# Patient Record
Sex: Female | Born: 1965 | Race: Asian | Hispanic: No | Marital: Single | Smoking: Never smoker
Health system: Southern US, Community
[De-identification: ages and names within clinical notes are randomized; demographics above are authoritative.]

---

## 2006-09-23 ENCOUNTER — Encounter (INDEPENDENT_AMBULATORY_CARE_PROVIDER_SITE_OTHER): Payer: Self-pay | Admitting: Specialist

## 2006-09-23 ENCOUNTER — Ambulatory Visit (HOSPITAL_COMMUNITY): Admission: RE | Admit: 2006-09-23 | Discharge: 2006-09-23 | Payer: Self-pay | Admitting: Obstetrics and Gynecology

## 2009-03-31 ENCOUNTER — Emergency Department (HOSPITAL_COMMUNITY): Admission: EM | Admit: 2009-03-31 | Discharge: 2009-03-31 | Payer: Self-pay | Admitting: Emergency Medicine

## 2010-12-27 NOTE — Op Note (Signed)
NAMEMICHAELLA, [name]                  ACCOUNT NO.:  [id number]   MEDICAL RECORD NO.:  [id number]          PATIENT TYPE:  AMB   LOCATION:  SDC                           FACILITY:  [facility]   PHYSICIAN:  Juluis Mire, M.D.   DATE OF BIRTH:  Mar 22, 1966   DATE OF PROCEDURE:  09/23/2006  DATE OF DISCHARGE:                               OPERATIVE REPORT   PREOPERATIVE DIAGNOSIS:  Endometrial polyp.   POSTOPERATIVE DIAGNOSIS:  Endometrial polyp.   OPERATIVE PROCEDURE:  Hysteroscopy, resection of polyp and subsequent  endometrial curettings.   SURGEON:  Juluis Mire, M.D.   ANESTHESIA:  General.   ESTIMATED BLOOD LOSS:  Minimal.   PACK AND DRAINS:  None.   INTRAOPERATIVE BLOOD REPLACEMENT:  None.   COMPLICATIONS:  None.   INDICATIONS:  In dictated history and physical.   DESCRIPTION OF PROCEDURE:  The patient was taken to the OR and placed in  a supine position.  After satisfactory level of general anesthesia was  obtained, the patient was placed in the dorsal lithotomy position using  the Allen stirrups.  The patient's perineum and vagina were prepped out  with Betadine and draped into a sterile field.  A speculum was placed in  the vaginal vault and cervix grasped with a single-tooth tenaculum.  Uterus sounded to 8 cm and cervix serially dilated to a size 35 Pratt  dilator.  Operative hysteroscope was introduced.  Intrauterine cavity  was distended using sorbitol.  Visualization revealed a polyp-like  outgrowth on the posterior wall; this was resected and sent for  pathologic review.  We then obtained endometrial curettings.  No other  pathology was noted.  Total deficit was 150 mL.  At this point in time,  a single-tooth tenaculum and speculum were then removed, patient taken  out of the dorsal lithotomy position and once alert and extubated,  transferred to recovery room in good condition.  Sponge, needle and  instrument count was reported as correct by circulating  nurse      Juluis Mire, M.D.  Electronically Signed     JSM/MEDQ  D:  09/23/2006  T:  09/23/2006  Job:  [id number]

## 2010-12-27 NOTE — H&P (Signed)
Sabrina [name], Sabrina [name]                  ACCOUNT NO.:  [id number]   MEDICAL RECORD NO.:  [id number]          PATIENT TYPE:  AMB   LOCATION:  SDC                           FACILITY:  [facility]   PHYSICIAN:  Juluis Mire, M.D.   DATE OF BIRTH:  1966/03/28   DATE OF ADMISSION:  09/23/2006  DATE OF DISCHARGE:                              HISTORY & PHYSICAL   HISTORY OF PRESENT ILLNESS:  The patient is a 45 year old gravida 2,  para 2 female who presents for saline infusion ultrasound.   In relation to the present admission, the patient has been having  increasing abnormal bleeding as well as right lower quadrant pain.  She  underwent an ultrasound that revealed a 1.2-cm polyp-like thickening of  the endometrium.  Ovaries were unremarkable.  We subsequently did a  saline infusion ultrasound that confirmed the presence of a relatively  large polyp.  She had an ovarian cyst that did resolve.  In view of the  abnormal bleeding, endometrial polyp and pelvic pain, we decided to  proceed with hysteroscopy.  We discussed the options of laparoscopy,  which she declined at the present time; therefore, we will proceed with  hysteroscopic resection of the polyp.   ALLERGIES:  NO KNOWN DRUG ALLERGIES.   MEDICATIONS:  None.   PAST MEDICAL HISTORY:  Usual childhood diseases.  No significant  sequelae.   PAST SURGICAL HISTORY:  She has had a ganglionic cyst removed from the  left hand.   OBSTETRICAL HISTORY:  Two vaginal deliveries.   FAMILY HISTORY:  Noncontributory.   SOCIAL HISTORY:  No tobacco or alcohol use.   REVIEW OF SYSTEMS:  Noncontributory.   PHYSICAL EXAM:  VITAL SIGNS:  The patient is afebrile with stable vital  signs.  HEENT:  The patient is normocephalic.  Pupils are equal, round and  reactive to light and accommodation.  Extraocular movements were intact.  Sclerae and conjunctivae are clear.  Oropharynx clear.  NECK:  Without thyromegaly.  BREASTS:  Not examined.  LUNGS:  Clear.  CARDIOVASCULAR:  Regular rhythm and rate.  No murmurs or gallops.  ABDOMEN:  Exam is benign.  No masses, organomegaly or tenderness.  PELVIC:  Normal external genitalia.  Vaginal mucosa is clear.  Cervix  unremarkable.  Uterus normal size, shape and contour.  Adnexa free of  masses or tenderness.  EXTREMITIES:  Trace edema.  NEUROLOGIC:  Exam is grossly within normal limits.   IMPRESSION:  Endometrial polyp.   PLAN:  The patient will undergo hysteroscopic resection of polyp.  The  risks have been explained including the risk of infection, the risk of  vascular injury that could lead to hemorrhage requiring transfusions  with the risk of AIDS or hepatitis.  There is also a potential risk if  bleeding is heavy for a hysterectomy.  There is also a risk of  perforation that could lead to injury to adjacent organs, requiring  exploratory surgery, risk of deep venous thrombosis and pulmonary  embolus.      Juluis Mire, M.D.  Electronically Signed     JSM/MEDQ  D:  09/23/2006  T:  09/23/2006  Job:  [id number]

## 2013-07-19 DIAGNOSIS — K2100 Gastro-esophageal reflux disease with esophagitis, without bleeding: Secondary | ICD-10-CM

## 2013-07-19 DIAGNOSIS — K644 Residual hemorrhoidal skin tags: Secondary | ICD-10-CM | POA: Insufficient documentation

## 2013-07-19 DIAGNOSIS — N921 Excessive and frequent menstruation with irregular cycle: Secondary | ICD-10-CM

## 2013-07-19 DIAGNOSIS — R1314 Dysphagia, pharyngoesophageal phase: Secondary | ICD-10-CM

## 2013-07-19 HISTORY — DX: Excessive and frequent menstruation with irregular cycle: N92.1

## 2013-07-19 HISTORY — DX: Dysphagia, pharyngoesophageal phase: R13.14

## 2013-07-19 HISTORY — DX: Residual hemorrhoidal skin tags: K64.4

## 2013-07-19 HISTORY — DX: Gastro-esophageal reflux disease with esophagitis, without bleeding: K21.00

## 2014-12-12 DIAGNOSIS — E739 Lactose intolerance, unspecified: Secondary | ICD-10-CM

## 2014-12-12 HISTORY — DX: Lactose intolerance, unspecified: E73.9

## 2017-09-18 DIAGNOSIS — E559 Vitamin D deficiency, unspecified: Secondary | ICD-10-CM

## 2017-09-18 HISTORY — DX: Vitamin D deficiency, unspecified: E55.9

## 2018-06-08 DIAGNOSIS — Z1211 Encounter for screening for malignant neoplasm of colon: Secondary | ICD-10-CM

## 2018-06-08 HISTORY — DX: Encounter for screening for malignant neoplasm of colon: Z12.11

## 2019-07-27 DIAGNOSIS — R7303 Prediabetes: Secondary | ICD-10-CM

## 2019-07-27 DIAGNOSIS — R1012 Left upper quadrant pain: Secondary | ICD-10-CM

## 2019-07-27 DIAGNOSIS — M79645 Pain in left finger(s): Secondary | ICD-10-CM

## 2019-07-27 DIAGNOSIS — G8929 Other chronic pain: Secondary | ICD-10-CM

## 2019-07-27 DIAGNOSIS — M25561 Pain in right knee: Secondary | ICD-10-CM | POA: Insufficient documentation

## 2019-07-27 HISTORY — DX: Left upper quadrant pain: R10.12

## 2019-07-27 HISTORY — DX: Pain in right knee: M25.561

## 2019-07-27 HISTORY — DX: Pain in left finger(s): M79.645

## 2019-07-27 HISTORY — DX: Other chronic pain: G89.29

## 2019-07-27 HISTORY — DX: Prediabetes: R73.03

## 2019-11-11 ENCOUNTER — Emergency Department (HOSPITAL_BASED_OUTPATIENT_CLINIC_OR_DEPARTMENT_OTHER)
Admission: EM | Admit: 2019-11-11 | Discharge: 2019-11-11 | Disposition: A | Discharge status: Home / Self Care | Payer: BC Managed Care – PPO | Attending: Emergency Medicine | Admitting: Emergency Medicine

## 2019-11-11 ENCOUNTER — Other Ambulatory Visit: Payer: Self-pay

## 2019-11-11 ENCOUNTER — Emergency Department (HOSPITAL_BASED_OUTPATIENT_CLINIC_OR_DEPARTMENT_OTHER): Payer: BC Managed Care – PPO

## 2019-11-11 ENCOUNTER — Encounter (HOSPITAL_BASED_OUTPATIENT_CLINIC_OR_DEPARTMENT_OTHER): Payer: Self-pay | Admitting: *Deleted

## 2019-11-11 DIAGNOSIS — R079 Chest pain, unspecified: Secondary | ICD-10-CM

## 2019-11-11 DIAGNOSIS — E876 Hypokalemia: Secondary | ICD-10-CM | POA: Insufficient documentation

## 2019-11-11 DIAGNOSIS — R0789 Other chest pain: Secondary | ICD-10-CM | POA: Insufficient documentation

## 2019-11-11 LAB — HEPATIC FUNCTION PANEL
ALT: 20 U/L (ref 0–44)
AST: 16 U/L (ref 15–41)
Albumin: 4.2 g/dL (ref 3.5–5.0)
Alkaline Phosphatase: 74 U/L (ref 38–126)
Bilirubin, Direct: 0.1 mg/dL (ref 0.0–0.2)
Indirect Bilirubin: 0.7 mg/dL (ref 0.3–0.9)
Total Bilirubin: 0.8 mg/dL (ref 0.3–1.2)
Total Protein: 7.4 g/dL (ref 6.5–8.1)

## 2019-11-11 LAB — TROPONIN I (HIGH SENSITIVITY)
Troponin I (High Sensitivity): 2 ng/L (ref <18)
Troponin I (High Sensitivity): 2 ng/L (ref <18)

## 2019-11-11 LAB — CBC
HCT: 40.4 % (ref 36.0–46.0)
Hemoglobin: 13.5 g/dL (ref 12.0–15.0)
MCH: 29.5 pg (ref 26.0–34.0)
MCHC: 33.4 g/dL (ref 30.0–36.0)
MCV: 88.4 fL (ref 80.0–100.0)
Platelets: 190 10*3/uL (ref 150–400)
RBC: 4.57 MIL/uL (ref 3.87–5.11)
RDW: 12.1 % (ref 11.5–15.5)
WBC: 6.3 10*3/uL (ref 4.0–10.5)
nRBC: 0 % (ref 0.0–0.2)

## 2019-11-11 LAB — BASIC METABOLIC PANEL
Anion gap: 8 (ref 5–15)
BUN: 10 mg/dL (ref 6–20)
CO2: 27 mmol/L (ref 22–32)
Calcium: 9 mg/dL (ref 8.9–10.3)
Chloride: 102 mmol/L (ref 98–111)
Creatinine, Ser: 0.55 mg/dL (ref 0.44–1.00)
GFR calc Af Amer: >60 mL/min (ref >60)
GFR calc non Af Amer: >60 mL/min (ref >60)
Glucose, Bld: 123 mg/dL — ABNORMAL HIGH (ref 70–99)
Potassium: 3.2 mmol/L — ABNORMAL LOW (ref 3.5–5.1)
Sodium: 137 mmol/L (ref 135–145)

## 2019-11-11 LAB — LIPASE, BLOOD: Lipase: 22 U/L (ref 11–51)

## 2019-11-11 MED ORDER — ALUM & MAG HYDROXIDE-SIMETH 200-200-20 MG/5ML PO SUSP
30.0000 mL | Freq: Once | ORAL | Status: AC
  Administered 2019-11-11: 30 mL via ORAL
  Filled 2019-11-11: qty 30

## 2019-11-11 MED ORDER — FAMOTIDINE IN NACL 20-0.9 MG/50ML-% IV SOLN
20.0000 mg | Freq: Once | INTRAVENOUS | Status: AC
  Administered 2019-11-11: 20 mg via INTRAVENOUS
  Filled 2019-11-11: qty 50

## 2019-11-11 MED ORDER — HYDROXYZINE HCL 10 MG PO TABS: 10.0000 mg | ORAL_TABLET | Freq: Three times a day (TID) | ORAL | 0 refills | Status: DC | PRN

## 2019-11-11 MED ORDER — SODIUM CHLORIDE 0.9 % IV BOLUS
1000.0000 mL | Freq: Once | INTRAVENOUS | Status: AC
  Administered 2019-11-11: 1000 mL via INTRAVENOUS

## 2019-11-11 MED ORDER — POTASSIUM CHLORIDE CRYS ER 20 MEQ PO TBCR
40.0000 meq | EXTENDED_RELEASE_TABLET | Freq: Once | ORAL | Status: AC
  Administered 2019-11-11: 21:00:00 40 meq via ORAL
  Filled 2019-11-11: qty 2

## 2019-11-11 MED ORDER — SUCRALFATE 1 GM/10ML PO SUSP: 1.0000 g | Freq: Three times a day (TID) | ORAL | 0 refills | Status: DC

## 2019-11-11 MED ORDER — POTASSIUM CHLORIDE CRYS ER 20 MEQ PO TBCR: 20.0000 meq | EXTENDED_RELEASE_TABLET | Freq: Every day | ORAL | 0 refills | Status: DC

## 2019-11-11 NOTE — Discharge Instructions (Signed)
You were seen in the emergency department today for chest pain. Your work-up in the emergency department has been overall reassuring. Your labs have been fairly normal and or similar to previous blood work you have had done- your potassium was low- we are sending you home with a supplement and diet guidelines.. Your EKG and the enzyme we use to check your heart did not show an acute heart attack at this time. Your chest x-ray showed possible very small amounts of fluid at the bottom of your lungs but looked overall very reassuring.    We are also sending you home with Atarax to take every 8 hours as needed for anxiety/stress as well as Carafate to take prior to meals and prior to bedtime to help coat your stomach and to avoid irritation.  Please continue your pantoprazole at home.  We have prescribed you new medication(s) today. Discuss the medications prescribed today with your pharmacist as they can have adverse effects and interactions with your other medicines including over the counter and prescribed medications. Seek medical evaluation if you start to experience new or abnormal symptoms after taking one of these medicines, seek care immediately if you start to experience difficulty breathing, feeling of your throat closing, facial swelling, or rash as these could be indications of a more serious allergic reaction   We would like you to follow up closely with your primary care provider and/or the cardiologist provided in your discharge instructions within 1-3 days. Return to the ER immediately should you experience any new or worsening symptoms including but not limited to return of pain, worsened pain, vomiting, shortness of breath, dizziness, lightheadedness, passing out, or any other concerns that you may have.

## 2019-11-11 NOTE — ED Provider Notes (Signed)
[facility]GENCY DEPART[facility] Provider Note   CSN: [id number] Arrival date & time: 11/11/19  1703     History Chief Complaint  Patient presents with  . Chest Pain    Sabrina [name] is a 54 y.o. female without significant past medical history who presents to the emergency department with complaints of chest pain for the past couple of days.  Patient states the pain is located in her central lower chest epigastric area, it is constant, worse after eating and with stress, no alleviating factors.  Discomfort is described as a burning pain that is currently a 2 out of 10 in severity.  She has associated feeling that her stomach is upset.  Denies fever, chills, vomiting, diarrhea, melena, hematochezia, dyspnea, prior CAD, leg pain/swelling, hemoptysis, recent surgery/trauma, recent long travel, hormone use, personal hx of cancer, or hx of DVT/PE.  Her sister is present at the bedside and is concerned that she is stressed due to her husband being sick with cancer, patient sometimes does not eat as much as she should.  HPI     History reviewed. No pertinent past medical history.  There are no problems to display for this patient.   History reviewed. No pertinent surgical history.   OB History   No obstetric history on file.     History reviewed. No pertinent family history.  Social History   Tobacco Use  . Smoking status: Never Smoker  . Smokeless tobacco: Never Used  Substance Use Topics  . Alcohol use: Never  . Drug use: Never    Home Medications Prior to Admission medications   Not on File    Allergies    Patient has no known allergies.  Review of Systems   Review of Systems  Constitutional: Negative for chills, diaphoresis and fever.  Respiratory: Negative for cough and shortness of breath.   Cardiovascular: Positive for chest pain. Negative for leg swelling.  Gastrointestinal: Positive for abdominal pain. Negative for blood in stool, constipation,  diarrhea and vomiting.  Genitourinary: Negative for dysuria.  Neurological: Negative for weakness and numbness.  All other systems reviewed and are negative.   Physical Exam Updated Vital Signs BP (!) 140/91   Pulse 77   Temp 98 F (36.7 C) (Oral)   Resp 20   Ht 4' 11.5" (1.511 m)   Wt 48.1 kg   SpO2 98%   BMI 21.05 kg/m   Physical Exam Vitals and nursing note reviewed.  Constitutional:      General: She is not in acute distress.    Appearance: She is well-developed. She is not toxic-appearing.  HENT:     Head: Normocephalic and atraumatic.  Eyes:     General:        Right eye: No discharge.        Left eye: No discharge.     Conjunctiva/sclera: Conjunctivae normal.  Cardiovascular:     Rate and Rhythm: Normal rate and regular rhythm.     Pulses:          Radial pulses are 2+ on the right side and 2+ on the left side.  Pulmonary:     Effort: Pulmonary effort is normal. No respiratory distress.     Breath sounds: Normal breath sounds. No wheezing, rhonchi or rales.  Chest:     Chest wall: No tenderness.  Abdominal:     General: There is no distension.     Palpations: Abdomen is soft.     Tenderness: There is abdominal tenderness (  very mild) in the epigastric area. There is no guarding or rebound. Negative signs include Murphy's sign.  Musculoskeletal:     Cervical back: Neck supple.     Right lower leg: No tenderness. No edema.     Left lower leg: No tenderness. No edema.  Skin:    General: Skin is warm and dry.     Findings: No rash.  Neurological:     Mental Status: She is alert.     Comments: Clear speech.   Psychiatric:        Behavior: Behavior normal.     ED Results / Procedures / Treatments   Labs (all labs ordered are listed, but only abnormal results are displayed) Labs Reviewed  BASIC METABOLIC PANEL - Abnormal; Notable for the following components:      Result Value   Potassium 3.2 (*)    Glucose, Bld 123 (*)    All other components within  normal limits  CBC  HEPATIC FUNCTION PANEL  LIPASE, BLOOD  TROPONIN I (HIGH SENSITIVITY)  TROPONIN I (HIGH SENSITIVITY)    EKG EKG Interpretation  Date/Time:  Friday November 11 2019 17:07:01 EDT Ventricular Rate:  77 PR Interval:  184 QRS Duration: 74 QT Interval:  370 QTC Calculation: 418 R Axis:   83 Text Interpretation: Normal sinus rhythm Normal ECG Confirmed by Marianna Fuss ([id number]) on 11/11/2019 8:53:44 PM   Radiology DG Chest 2 View  Result Date: 11/11/2019 CLINICAL DATA:  Chest pain. Dizziness. EXAM: CHEST - 2 VIEW COMPARISON:  None. FINDINGS: The cardiomediastinal contours are normal. Possible tiny pleural effusions. Pulmonary vasculature is normal. No consolidation or pneumothorax. No acute osseous abnormalities are seen. IMPRESSION: Possible tiny pleural effusions. Electronically Signed   By: Narda Rutherford M.D.   On: 11/11/2019 18:45    Procedures Procedures (including critical care time)  Medications Ordered in ED Medications  potassium chloride SA (KLOR-CON) CR tablet 40 mEq (has no administration in time range)  sodium chloride 0.9 % bolus 1,000 mL ( Intravenous Stopped 11/11/19 1951)  famotidine (PEPCID) IVPB 20 mg premix (0 mg Intravenous Stopped 11/11/19 1923)  alum & mag hydroxide-simeth (MAALOX/MYLANTA) 200-200-20 MG/5ML suspension 30 mL (30 mLs Oral Given 11/11/19 2024)    ED Course  I have reviewed the triage vital signs and the nursing notes.  Pertinent labs & imaging results that were available during my care of the patient were reviewed by me and considered in my medical decision making (see chart for details).    MDM Rules/Calculators/A&P                      Patient presents to the emergency department with chest pain. Patient nontoxic appearing, in no apparent distress, vitals without significant abnormality. Fairly benign physical exam, very mild epigastric tenderness without peritoneal signs, negative murphys.   DDX: ACS, pulmonary embolism,  dissection, pneumothorax, pneumonia, arrhythmia, severe anemia, MSK, GERD, PUD, pancreatitis, cholecystitis, cholelithiasis anxiety/stress. Evaluation initiated with labs, EKG, and CXR. Patient on cardiac monitor.   CBC: No anemia or leukocytosis.  BMP: Mild hypokalemia- will orally replace. Renal function preserved.  Hepatic function panel: WNL Lipase: WNL Troponin: 2, 2 EKG: NSR, no STEMI CXR: IMPRESSION per radiology: Possible tiny pleural effusions. Personally reviewed & interpreted with Dr. Stevie Kern, no obvious effusion upon our review- lungs clear to auscultation on exam, no signs of fluid overload.    Hearth Pathway Score low risk- EKG without obvious acute ischemia, delta troponin negative, doubt ACS. Patient is low risk  wells, doubt pulmonary embolism. Pain is not a tearing sensation, symmetric pulses, no widening of mediastinum on CXR, doubt dissection.  Repeat abdominal exam remains without peritoneal signs, do not suspect acute cholecystitis, pancreatitis, choledocholithiasis, or acute surgical abdomen.  Patient feeling somewhat improved status post interventions here in the emergency department.  Unclear definitive etiology, considering GERD, PUD, as well as stress/anxiety induced.  She is currently taking pantoprazole, will give Carafate as well as as needed Atarax with PCP follow-up.  Cardiology information also provided. I discussed results, treatment plan, need for follow-up, and return precautions with the patient & her sister @ bedside. Provided opportunity for questions, patient & her sister confirmed understanding and are in agreement with plan.   Findings and plan of care discussed with supervising physician Dr. Stevie Kern who is in agreement.   Final Clinical Impression(s) / ED Diagnoses Final diagnoses:  Chest pain, unspecified type  Hypokalemia    Rx / DC Orders ED Discharge Orders         Ordered    hydrOXYzine (ATARAX/VISTARIL) 10 MG tablet  3 times daily PRN      11/11/19 2116    potassium chloride SA (KLOR-CON) 20 MEQ tablet  Daily     11/11/19 2116    sucralfate (CARAFATE) 1 GM/10ML suspension  3 times daily with meals & bedtime     11/11/19 2116           Cherly Anderson, PA-C 11/11/19 2124    Milagros Loll, MD 11/14/19 [phone]

## 2019-11-11 NOTE — ED Notes (Signed)
Pt. To radiology ... will start fluids when pt. Returns.

## 2019-11-11 NOTE — ED Notes (Signed)
ED Provider at bedside. 

## 2019-11-11 NOTE — ED Triage Notes (Signed)
Pain in the center of her chest since yesterday. Denies cough.

## 2019-11-24 ENCOUNTER — Ambulatory Visit (INDEPENDENT_AMBULATORY_CARE_PROVIDER_SITE_OTHER): Payer: BC Managed Care – PPO | Admitting: Cardiology

## 2019-11-24 ENCOUNTER — Encounter: Payer: Self-pay | Admitting: Cardiology

## 2019-11-24 ENCOUNTER — Other Ambulatory Visit: Payer: Self-pay

## 2019-11-24 DIAGNOSIS — R0789 Other chest pain: Secondary | ICD-10-CM | POA: Insufficient documentation

## 2019-11-24 DIAGNOSIS — R072 Precordial pain: Secondary | ICD-10-CM | POA: Diagnosis not present

## 2019-11-24 HISTORY — DX: Other chest pain: R07.89

## 2019-11-24 NOTE — Patient Instructions (Addendum)
Medication Instructions:  Continue your current medications.   *If you need a refill on your cardiac medications before your next appointment, please call your pharmacy*   Lab Work: No lab work today.   If you have labs (blood work) drawn today and your tests are completely normal, you will receive your results only by: Marland Kitchen MyChart Message (if you have MyChart) OR . A paper copy in the mail If you have any lab test that is abnormal or we need to change your treatment, we will call you to review the results.   Testing/Procedures: Your physician has requested that you have a lexiscan myoview. For further information please visit [email or web address] Please follow instruction sheet, as given.   Follow-Up: At [facility], you and your health needs are our priority.  As part of our continuing mission to provide you with exceptional heart care, we have created designated Provider Care Teams.  These Care Teams include your primary Cardiologist (physician) and Advanced Practice Providers (APPs -  Physician Assistants and Nurse Practitioners) who all work together to provide you with the care you need, when you need it.  We recommend signing up for the patient portal called "MyChart".  Sign up information is provided on this After Visit Summary.  MyChart is used to connect with patients for Virtual Visits (Telemedicine).  Patients are able to view lab/test results, encounter notes, upcoming appointments, etc.  Non-urgent messages can be sent to your provider as well.   To learn more about what you can do with MyChart, go to [email or web address].    Your next appointment:   3 month(s)  The format for your next appointment:   In Person  Provider:   You may see Garwin Brothers, MD or the following Advanced Practice Provider on your designated Care Team:    Gillian Shields, FNP  Other Instructions  Your physician has requested that you have a lexiscan myoview. For further information  please visit [email or web address] Please follow instruction sheet, as given.  The test will take approximately 3 to 4 hours to complete; you may bring reading material.  If someone comes with you to your appointment, they will need to remain in the main lobby due to limited space in the testing area. **If you are pregnant or breastfeeding, please notify the nuclear lab prior to your appointment**  How to prepare for your Myocardial Perfusion Test: . Do not eat or drink 3 hours prior to your test, except you may have water. . Do not consume products containing caffeine (regular or decaffeinated) 12 hours prior to your test. (ex: coffee, chocolate, sodas, tea). . Do bring a list of your current medications with you.  If not listed below, you may take your medications as normal. . Do wear comfortable clothes (no dresses or overalls) and walking shoes, tennis shoes preferred (No heels or open toe shoes are allowed). . Do NOT wear cologne, perfume, aftershave, or lotions (deodorant is allowed). . If these instructions are not followed, your test will have to be rescheduled.

## 2019-11-24 NOTE — Progress Notes (Signed)
Cardiology Office Note:    Date:  11/24/2019   ID:  Sabrina [name], Cregan Jul 11, 1966, MRN [id number]  PCP:  System, Pcp Not In  Cardiologist:  Garwin Brothers, MD   Referring MD: Milagros Loll, MD    ASSESSMENT:    1. Precordial pain   2. Chest discomfort    PLAN:    In order of problems listed above:  1. Primary prevention stressed with the patient.  Importance of compliance with diet and medication stressed and she vocalized understanding. 2. Chest discomfort: Her symptoms are atypical.  She is very concerned about the symptoms and went to the emergency room.  I reviewed emergency room records extensively.  She tells me that her lipids are fine and were checked by her primary care physician.  In view of this test to be on the sure side about her cardiovascular evaluation I will do a Lexiscan sestamibi.  She is agreeable on this.  Further recommendations will be made based on the findings of the stress test.  If it is negative I have asked her to embark on a graded exercise program and she is agreeable to do so.  She knows to go to the nearest emergency room for any concerning symptoms. 3. Patient will be seen in follow-up appointment in 3 months or earlier if the patient has any concerns    Medication Adjustments/Labs and Tests Ordered: Current medicines are reviewed at length with the patient today.  Concerns regarding medicines are outlined above.  No orders of the defined types were placed in this encounter.  No orders of the defined types were placed in this encounter.    History of Present Illness:    Sabrina [name] is a 54 y.o. female who is being seen today for the evaluation of chest discomfort at the request of Dykstra, Quitman Livings, MD.  Patient is a pleasant 54 year old female.  She has no significant past medical history from a cardiovascular standpoint.  She denies any history of hypertension dyslipidemia or diabetes mellitus.  She is an active lady.  Unfortunately earlier  this year her husband got diagnosed with lung cancer and what treatment according to the patient he underwent renal shutdown and now on dialysis.  Patient denies any chest pain orthopnea or PND at this time.  She is an active lady but does not exercise on a regular basis because of her husband's condition and she is fully involved in his care at this time.  She tells me that since the diagnosis she is anxious and nervous.  At the time of my evaluation, the patient is alert awake oriented and in no distress.  She mentions to me that she occasionally has chest discomfort not related to exertion.  No radiation to the neck or to the arms.  She has been on omeprazole now and this has helped her symptoms.  History reviewed. No pertinent past medical history.  History reviewed. No pertinent surgical history.  Current Medications: Current Meds  Medication Sig  . omeprazole (PRILOSEC) 20 MG capsule Take 20 mg by mouth daily.     Allergies:   Patient has no known allergies.   Social History   Socioeconomic History  . Marital status: Single    Spouse name: Not on file  . Number of children: Not on file  . Years of education: Not on file  . Highest education level: Not on file  Occupational History  . Not on file  Tobacco Use  . Smoking status:  Never Smoker  . Smokeless tobacco: Never Used  Substance and Sexual Activity  . Alcohol use: Never  . Drug use: Never  . Sexual activity: Not on file  Other Topics Concern  . Not on file  Social History Narrative  . Not on file   Social Determinants of Health   Financial Resource Strain:   . Difficulty of Paying Living Expenses:   Food Insecurity:   . Worried About Charity fundraiser in the Last Year:   . Arboriculturist in the Last Year:   Transportation Needs:   . Film/video editor (Medical):   Marland Kitchen Lack of Transportation (Non-Medical):   Physical Activity:   . Days of Exercise per Week:   . Minutes of Exercise per Session:   Stress:    . Feeling of Stress :   Social Connections:   . Frequency of Communication with Friends and Family:   . Frequency of Social Gatherings with Friends and Family:   . Attends Religious Services:   . Active Member of Clubs or Organizations:   . Attends Archivist Meetings:   Marland Kitchen Marital Status:      Family History: The patient's family history is not on file.  ROS:   Please see the history of present illness.    All other systems reviewed and are negative.  EKGs/Labs/Other Studies Reviewed:    The following studies were reviewed today: EKG reveals sinus rhythm and nonspecific ST-T changes.   Recent Labs: 11/11/2019: ALT 20; BUN 10; Creatinine, Ser 0.55; Hemoglobin 13.5; Platelets 190; Potassium 3.2; Sodium 137  Recent Lipid Panel No results found for: CHOL, TRIG, HDL, CHOLHDL, VLDL, LDLCALC, LDLDIRECT  Physical Exam:    VS:  BP 108/74   Pulse 72   Ht 4' 11.5" (1.511 m)   Wt 105 lb (47.6 kg)   SpO2 98%   BMI 20.85 kg/m     Wt Readings from Last 3 Encounters:  11/24/19 105 lb (47.6 kg)  11/11/19 106 lb (48.1 kg)     GEN: Patient is in no acute distress HEENT: Normal NECK: No JVD; No carotid bruits LYMPHATICS: No lymphadenopathy CARDIAC: S1 S2 regular, 2/6 systolic murmur at the apex. RESPIRATORY:  Clear to auscultation without rales, wheezing or rhonchi  ABDOMEN: Soft, non-tender, non-distended MUSCULOSKELETAL:  No edema; No deformity  SKIN: Warm and dry NEUROLOGIC:  Alert and oriented x 3 PSYCHIATRIC:  Normal affect    Signed, Jenean Lindau, MD  11/24/2019 10:32 AM    [place]

## 2019-11-29 ENCOUNTER — Telehealth (HOSPITAL_COMMUNITY): Payer: Self-pay

## 2019-11-29 NOTE — Telephone Encounter (Signed)
Spoke with the patient, detailed instructions given. She stated she understood and would be here for her test. Asked to call back with any questions. S.Amneet Cendejas EMTP ?

## 2019-12-01 ENCOUNTER — Other Ambulatory Visit: Payer: Self-pay

## 2019-12-01 ENCOUNTER — Ambulatory Visit (HOSPITAL_COMMUNITY): Payer: BC Managed Care – PPO | Attending: Cardiology

## 2019-12-01 DIAGNOSIS — R072 Precordial pain: Secondary | ICD-10-CM | POA: Diagnosis not present

## 2019-12-01 DIAGNOSIS — R0789 Other chest pain: Secondary | ICD-10-CM | POA: Insufficient documentation

## 2019-12-01 LAB — MYOCARDIAL PERFUSION IMAGING
LV dias vol: 40 mL (ref 46–106)
LV sys vol: 8 mL
Peak HR: 110 {beats}/min
Rest HR: 71 {beats}/min
SDS: 3
SRS: 1
SSS: 4
TID: 0.99

## 2019-12-01 MED ORDER — TECHNETIUM TC 99M TETROFOSMIN IV KIT
30.6000 | PACK | Freq: Once | INTRAVENOUS | Status: AC | PRN
  Administered 2019-12-01: 30.6 via INTRAVENOUS
  Filled 2019-12-01: qty 31

## 2019-12-01 MED ORDER — TECHNETIUM TC 99M TETROFOSMIN IV KIT
9.8000 | PACK | Freq: Once | INTRAVENOUS | Status: AC | PRN
  Administered 2019-12-01: 9.8 via INTRAVENOUS
  Filled 2019-12-01: qty 10

## 2019-12-01 MED ORDER — REGADENOSON 0.4 MG/5ML IV SOLN
0.4000 mg | Freq: Once | INTRAVENOUS | Status: AC
  Administered 2019-12-01: 0.4 mg via INTRAVENOUS

## 2020-01-04 ENCOUNTER — Encounter: Payer: Self-pay | Admitting: Cardiology

## 2020-03-06 ENCOUNTER — Ambulatory Visit: Payer: BC Managed Care – PPO | Admitting: Cardiology

## 2020-03-07 ENCOUNTER — Ambulatory Visit (INDEPENDENT_AMBULATORY_CARE_PROVIDER_SITE_OTHER): Payer: BC Managed Care – PPO | Admitting: Cardiology

## 2020-03-07 ENCOUNTER — Other Ambulatory Visit: Payer: Self-pay

## 2020-03-07 ENCOUNTER — Encounter: Payer: Self-pay | Admitting: Cardiology

## 2020-03-07 VITALS — BP 112/62 | HR 78 | Ht 60.0 in | Wt 108.0 lb

## 2020-03-07 DIAGNOSIS — R0789 Other chest pain: Secondary | ICD-10-CM

## 2020-03-07 NOTE — Patient Instructions (Signed)

## 2020-03-07 NOTE — Progress Notes (Signed)
Cardiology Office Note:    Date:  03/07/2020   ID:  Harrietta [name], [name] 02-Jul-1966, MRN [id number]  PCP:  System, Pcp Not In  Cardiologist:  Garwin Brothers, MD   Referring MD: No ref. provider found    ASSESSMENT:    1. Chest discomfort    PLAN:    In order of problems listed above:  1. Primary prevention stressed with the patient. Importance of compliance with diet medication stressed and she vocalized understanding. She exercises on a regular basis. She walks about 3 miles a day without any symptoms. She has bilateral recent lipid check was fine. Her blood pressure stable. She'll be seen in follow-up appointment on annual basis. Patient had multiple questions which were answered to her satisfaction.   Medication Adjustments/Labs and Tests Ordered: Current medicines are reviewed at length with the patient today.  Concerns regarding medicines are outlined above.  No orders of the defined types were placed in this encounter.  No orders of the defined types were placed in this encounter.    No chief complaint on file.    History of Present Illness:    Sabrina [name] is a 54 y.o. female. Patient was evaluated by me for chest discomfort. Her testing was unremarkable. She denies any problems at this time and takes care of activities of daily living. No chest pain orthopnea or PND. At the time of my evaluation, the patient is alert awake oriented and in no distress.  Past Medical History:  Diagnosis Date  . Chest discomfort 11/24/2019  . Chronic LUQ pain 07/27/2019  . Dysphagia, pharyngoesophageal phase 07/19/2013  . Encounter for screening colonoscopy 06/08/2018   06/08/18 - No polyps. Repeat colonoscopy in 10 years - [facility]  Formatting of this note might be different from the original. 06/08/18 - No polyps. Repeat colonoscopy in 10 years - [facility]  . External hemorrhoids with other complication 07/19/2013  . Gastro-esophageal reflux disease with esophagitis 07/19/2013  .  Lactose intolerance 12/12/2014  . Metrorrhagia 07/19/2013  . Pain of finger of left hand 07/27/2019  . Prediabetes 07/27/2019  . Right knee pain 07/27/2019  . Vitamin D deficiency 09/18/2017    History reviewed. No pertinent surgical history.  Current Medications: No outpatient medications have been marked as taking for the 03/07/20 encounter (Office Visit) with Criss Pallone, Aundra Dubin, MD.     Allergies:   Patient has no known allergies.   Social History   Socioeconomic History  . Marital status: Single    Spouse name: Not on file  . Number of children: Not on file  . Years of education: Not on file  . Highest education level: Not on file  Occupational History  . Not on file  Tobacco Use  . Smoking status: Never Smoker  . Smokeless tobacco: Never Used  Substance and Sexual Activity  . Alcohol use: Never  . Drug use: Never  . Sexual activity: Not on file  Other Topics Concern  . Not on file  Social History Narrative  . Not on file   Social Determinants of Health   Financial Resource Strain:   . Difficulty of Paying Living Expenses:   Food Insecurity:   . Worried About Programme researcher, broadcasting/film/video in the Last Year:   . Barista in the Last Year:   Transportation Needs:   . Freight forwarder (Medical):   Marland Kitchen Lack of Transportation (Non-Medical):   Physical Activity:   . Days of Exercise per Week:   .  Minutes of Exercise per Session:   Stress:   . Feeling of Stress :   Social Connections:   . Frequency of Communication with Friends and Family:   . Frequency of Social Gatherings with Friends and Family:   . Attends Religious Services:   . Active Member of Clubs or Organizations:   . Attends Banker Meetings:   Marland Kitchen Marital Status:      Family History: The patient's family history is not on file.  ROS:   Please see the history of present illness.    All other systems reviewed and are negative.  EKGs/Labs/Other Studies Reviewed:    The following studies  were reviewed today: I discussed my findings with the patient at length   Recent Labs: 11/11/2019: ALT 20; BUN 10; Creatinine, Ser 0.55; Hemoglobin 13.5; Platelets 190; Potassium 3.2; Sodium 137  Recent Lipid Panel No results found for: CHOL, TRIG, HDL, CHOLHDL, VLDL, LDLCALC, LDLDIRECT  Physical Exam:    VS:  BP (!) 112/62   Pulse 78   Ht 5' (1.524 m)   Wt 108 lb (49 kg)   SpO2 97%   BMI 21.09 kg/m     Wt Readings from Last 3 Encounters:  03/07/20 108 lb (49 kg)  11/24/19 105 lb (47.6 kg)  11/11/19 106 lb (48.1 kg)     GEN: Patient is in no acute distress HEENT: Normal NECK: No JVD; No carotid bruits LYMPHATICS: No lymphadenopathy CARDIAC: Hear sounds regular, 2/6 systolic murmur at the apex. RESPIRATORY:  Clear to auscultation without rales, wheezing or rhonchi  ABDOMEN: Soft, non-tender, non-distended MUSCULOSKELETAL:  No edema; No deformity  SKIN: Warm and dry NEUROLOGIC:  Alert and oriented x 3 PSYCHIATRIC:  Normal affect   Signed, Garwin Brothers, MD  03/07/2020 9:41 AM    [facility]

## 2021-11-09 IMAGING — DX DG CHEST 2V
2 series · 2 of 2 positions shown · non-contrast
Comparison: None.

CLINICAL DATA: Chest pain. Dizziness.

EXAM:
CHEST - 2 VIEW

[chest pa]
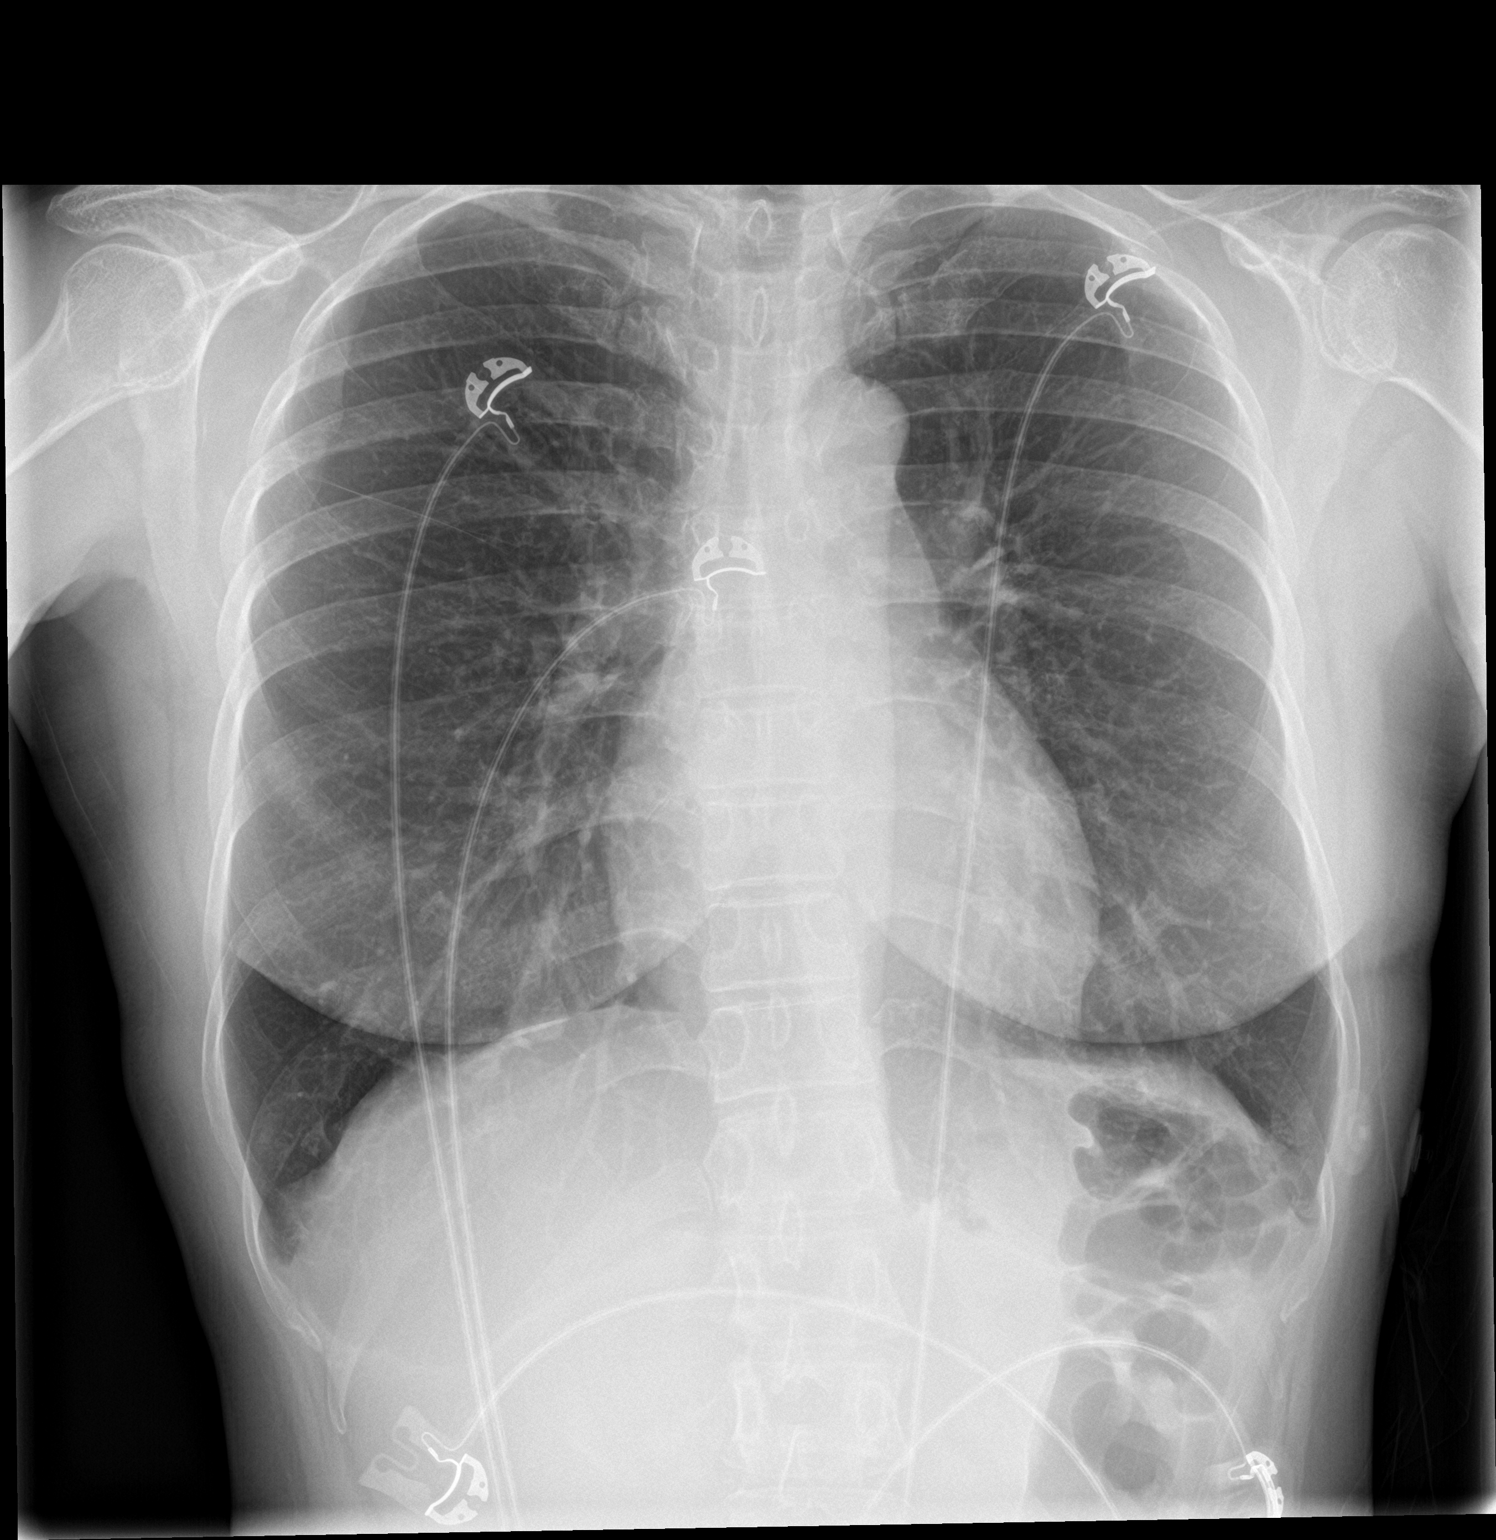

[chest lat]
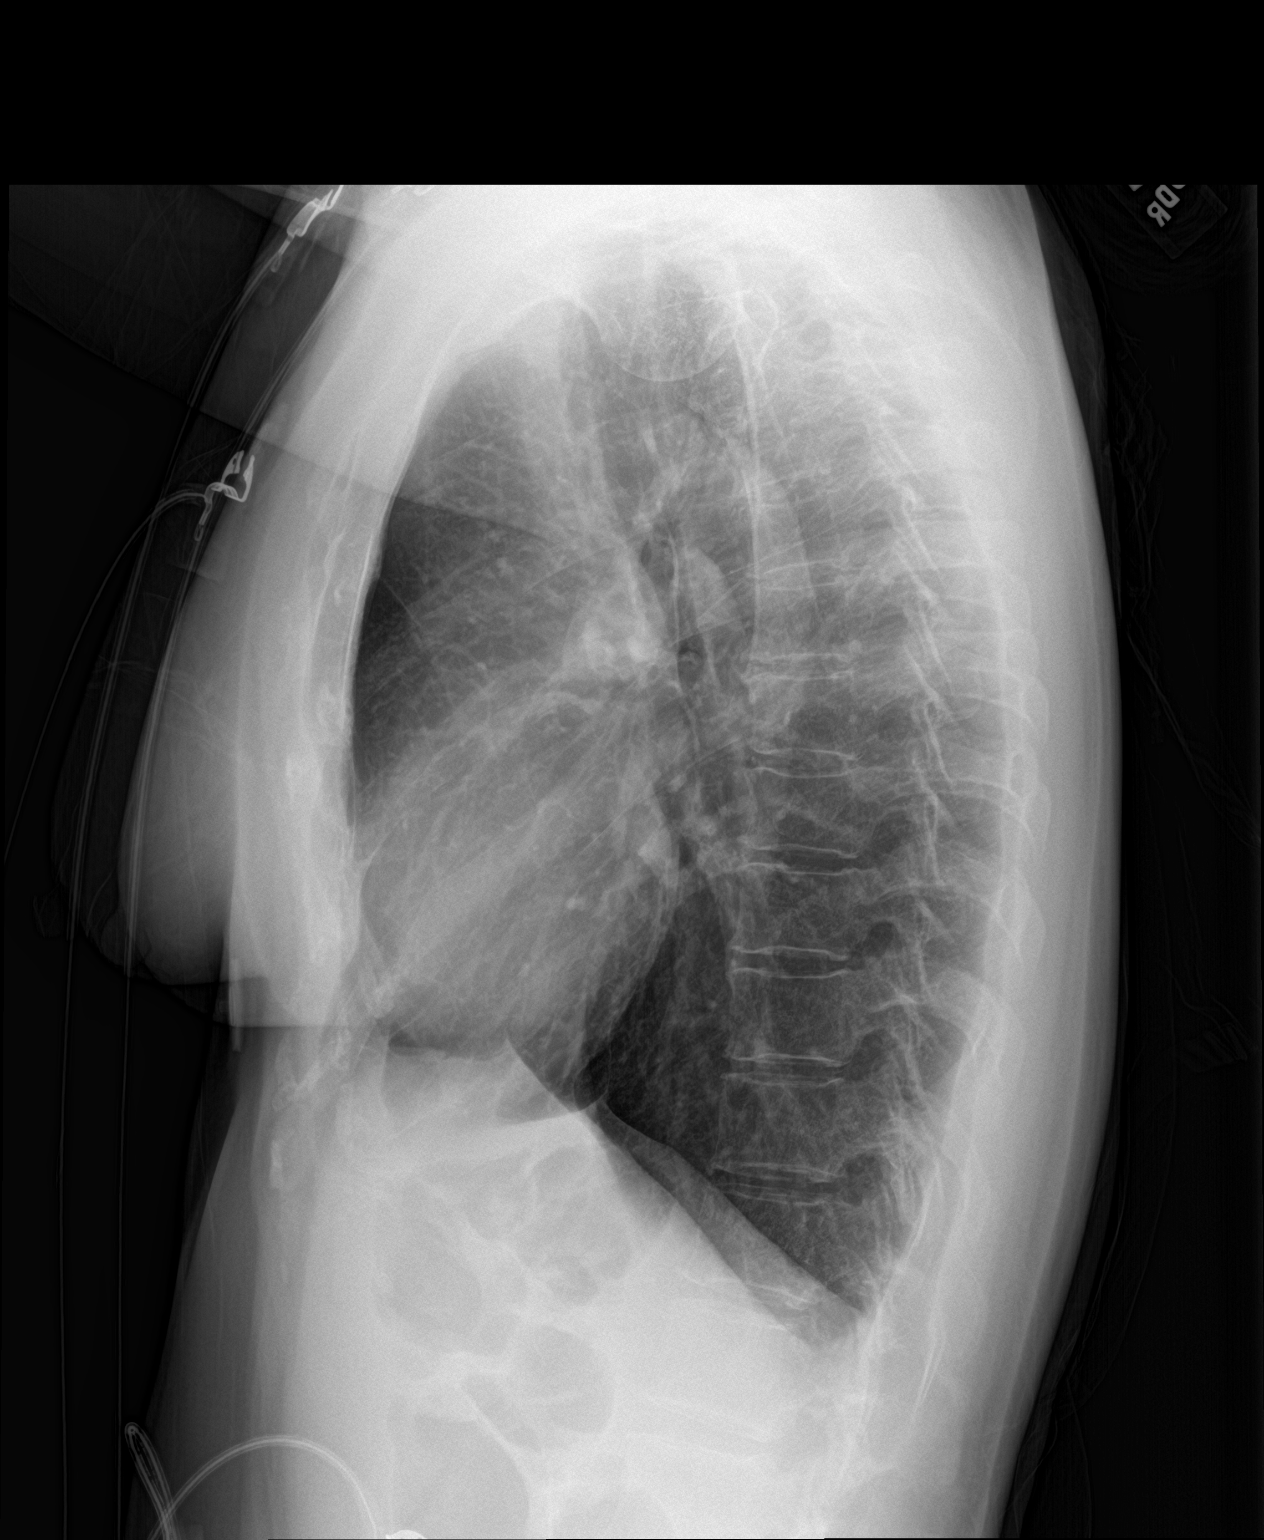

[2 of 2 positions shown; findings below may reference images not displayed]

FINDINGS: The cardiomediastinal contours are normal. Possible tiny pleural
effusions. Pulmonary vasculature is normal. No consolidation or
pneumothorax. No acute osseous abnormalities are seen.
IMPRESSION: Possible tiny pleural effusions.
